# Patient Record
Sex: Male | Born: 1965 | Race: White | Hispanic: No | Marital: Single | Smoking: Never smoker
Health system: Southern US, Community
[De-identification: ages and names within clinical notes are randomized; demographics above are authoritative.]

---

## 2020-01-08 ENCOUNTER — Emergency Department (HOSPITAL_COMMUNITY)
Admission: EM | Admit: 2020-01-08 | Discharge: 2020-01-08 | Disposition: A | Discharge status: Home / Self Care | Payer: No Typology Code available for payment source | Attending: Emergency Medicine | Admitting: Emergency Medicine

## 2020-01-08 ENCOUNTER — Encounter (HOSPITAL_COMMUNITY): Payer: Self-pay

## 2020-01-08 ENCOUNTER — Emergency Department (HOSPITAL_COMMUNITY): Payer: Self-pay | Attending: Emergency Medicine

## 2020-01-08 ENCOUNTER — Other Ambulatory Visit: Payer: Self-pay

## 2020-01-08 DIAGNOSIS — Y929 Unspecified place or not applicable: Secondary | ICD-10-CM | POA: Diagnosis not present

## 2020-01-08 DIAGNOSIS — M542 Cervicalgia: Secondary | ICD-10-CM | POA: Diagnosis not present

## 2020-01-08 DIAGNOSIS — S0001XA Abrasion of scalp, initial encounter: Secondary | ICD-10-CM | POA: Insufficient documentation

## 2020-01-08 DIAGNOSIS — Y990 Civilian activity done for income or pay: Secondary | ICD-10-CM | POA: Diagnosis not present

## 2020-01-08 DIAGNOSIS — Z23 Encounter for immunization: Secondary | ICD-10-CM | POA: Diagnosis not present

## 2020-01-08 DIAGNOSIS — Y939 Activity, unspecified: Secondary | ICD-10-CM | POA: Insufficient documentation

## 2020-01-08 DIAGNOSIS — S02609A Fracture of mandible, unspecified, initial encounter for closed fracture: Secondary | ICD-10-CM | POA: Diagnosis not present

## 2020-01-08 DIAGNOSIS — S0990XA Unspecified injury of head, initial encounter: Secondary | ICD-10-CM | POA: Diagnosis present

## 2020-01-08 MED ORDER — OXYCODONE HCL 5 MG PO TABS
5.0000 mg | ORAL_TABLET | Freq: Once | ORAL | Status: DC
  Filled 2020-01-08: qty 1

## 2020-01-08 MED ORDER — OXYCODONE HCL 5 MG PO TABS: 5.0000 mg | ORAL_TABLET | ORAL | 0 refills | Status: AC | PRN

## 2020-01-08 MED ORDER — TETANUS-DIPHTH-ACELL PERTUSSIS 5-2.5-18.5 LF-MCG/0.5 IM SUSP
0.5000 mL | Freq: Once | INTRAMUSCULAR | Status: AC
  Administered 2020-01-08: 0.5 mL via INTRAMUSCULAR
  Filled 2020-01-08: qty 0.5

## 2020-01-08 NOTE — Discharge Instructions (Addendum)
You were evaluated in the Emergency Department and after careful evaluation, we did not find any emergent condition requiring admission or further testing in the hospital.  Your CT scans showed a broken mandible (jawbone) on the right side.  As discussed, it is very important that you avoid any chewing at all.  This is your best chance of avoiding a need for surgery.  Please follow-up with Dr. Kenney Houseman this coming week.  Call Dr. Kenney Houseman first thing Monday morning.  Please return to the Emergency Department if you experience any worsening of your condition.  We encourage you to follow up with a primary care provider.  Thank you for allowing [place] to be a part of your care.
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
## 2020-01-08 NOTE — ED Notes (Signed)
C Collar removed by pt after educating why he should not remove it
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
